# Patient Record
Sex: Female | Born: 1995 | Hispanic: No | Marital: Single | Smoking: Never smoker
Health system: Southern US, Community
[De-identification: ages and names within clinical notes are randomized; demographics above are authoritative.]

---
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
## 2018-08-14 ENCOUNTER — Encounter (HOSPITAL_COMMUNITY): Payer: Self-pay | Admitting: Emergency Medicine

## 2018-08-14 ENCOUNTER — Ambulatory Visit (HOSPITAL_COMMUNITY)
Admission: EM | Admit: 2018-08-14 | Discharge: 2018-08-14 | Disposition: A | Discharge status: Home / Self Care | Payer: BLUE CROSS/BLUE SHIELD | Attending: Family Medicine | Admitting: Family Medicine

## 2018-08-14 DIAGNOSIS — M542 Cervicalgia: Secondary | ICD-10-CM

## 2018-08-14 DIAGNOSIS — G44311 Acute post-traumatic headache, intractable: Secondary | ICD-10-CM

## 2018-08-14 DIAGNOSIS — M546 Pain in thoracic spine: Secondary | ICD-10-CM

## 2018-08-14 MED ORDER — METHOCARBAMOL 500 MG PO TABS: 500.0000 mg | ORAL_TABLET | Freq: Two times a day (BID) | ORAL | 0 refills | Status: DC
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
## 2018-10-09 ENCOUNTER — Emergency Department (HOSPITAL_COMMUNITY): Payer: No Typology Code available for payment source

## 2018-10-09 ENCOUNTER — Encounter (HOSPITAL_COMMUNITY): Payer: Self-pay

## 2018-10-09 ENCOUNTER — Emergency Department (HOSPITAL_COMMUNITY)
Admission: EM | Admit: 2018-10-09 | Discharge: 2018-10-09 | Disposition: A | Discharge status: Home / Self Care | Payer: No Typology Code available for payment source | Attending: Emergency Medicine | Admitting: Emergency Medicine

## 2018-10-09 DIAGNOSIS — M25512 Pain in left shoulder: Secondary | ICD-10-CM | POA: Diagnosis not present

## 2018-10-09 DIAGNOSIS — S0990XA Unspecified injury of head, initial encounter: Secondary | ICD-10-CM

## 2018-10-09 DIAGNOSIS — R0789 Other chest pain: Secondary | ICD-10-CM | POA: Insufficient documentation

## 2018-10-09 DIAGNOSIS — Y999 Unspecified external cause status: Secondary | ICD-10-CM | POA: Diagnosis not present

## 2018-10-09 DIAGNOSIS — Y939 Activity, unspecified: Secondary | ICD-10-CM | POA: Insufficient documentation

## 2018-10-09 DIAGNOSIS — Y929 Unspecified place or not applicable: Secondary | ICD-10-CM | POA: Insufficient documentation

## 2018-10-09 DIAGNOSIS — S199XXA Unspecified injury of neck, initial encounter: Secondary | ICD-10-CM | POA: Diagnosis present

## 2018-10-09 DIAGNOSIS — S161XXA Strain of muscle, fascia and tendon at neck level, initial encounter: Secondary | ICD-10-CM | POA: Diagnosis not present

## 2018-10-09 LAB — POC URINE PREG, ED: Preg Test, Ur: NEGATIVE

## 2018-10-09 MED ORDER — ACETAMINOPHEN 325 MG PO TABS: 650.0000 mg | ORAL_TABLET | Freq: Four times a day (QID) | ORAL | 0 refills | Status: AC | PRN

## 2018-10-09 MED ORDER — METHOCARBAMOL 500 MG PO TABS: 500.0000 mg | ORAL_TABLET | Freq: Two times a day (BID) | ORAL | 0 refills | Status: AC
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
## 2018-10-13 ENCOUNTER — Encounter (HOSPITAL_COMMUNITY): Payer: Self-pay | Admitting: Emergency Medicine

## 2018-10-13 ENCOUNTER — Ambulatory Visit (HOSPITAL_COMMUNITY)
Admission: EM | Admit: 2018-10-13 | Discharge: 2018-10-13 | Disposition: A | Discharge status: Home / Self Care | Payer: Self-pay | Attending: Family Medicine | Admitting: Family Medicine

## 2018-10-13 DIAGNOSIS — M545 Low back pain, unspecified: Secondary | ICD-10-CM

## 2018-10-13 DIAGNOSIS — M7918 Myalgia, other site: Secondary | ICD-10-CM

## 2018-10-13 MED ORDER — IBUPROFEN 600 MG PO TABS: 600.0000 mg | ORAL_TABLET | Freq: Four times a day (QID) | ORAL | 0 refills | Status: AC | PRN
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
## 2018-10-13 NOTE — Discharge Instructions (Signed)
I expect pain to gradually resolve over the next 2 to 3 weeks Use anti-inflammatories for pain/swelling. You may take up to 800 mg Ibuprofen every 8 hours with food. You may supplement Ibuprofen with Tylenol [phone] mg every 8 hours.  You may continue to supplement with a muscle relaxer provided to you by the emergency room Alternate ice and heat  Please follow-up if still not seeing any improvement in symptoms with time and the next 2 weeks, worsening, developing weakness in extremities, numbness or tingling
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
## 2020-09-14 ENCOUNTER — Other Ambulatory Visit: Payer: Medicaid Other

## 2020-09-14 DIAGNOSIS — Z20822 Contact with and (suspected) exposure to covid-19: Secondary | ICD-10-CM

## 2020-09-16 LAB — NOVEL CORONAVIRUS, NAA: SARS-CoV-2, NAA: NOT DETECTED

## 2020-09-16 LAB — SARS-COV-2, NAA 2 DAY TAT
